# Patient Record
Sex: Male | Born: 1990 | Race: Black or African American | Hispanic: No | Marital: Single | Smoking: Current every day smoker
Health system: Southern US, Community
[De-identification: ages and names within clinical notes are randomized; demographics above are authoritative.]

## PROBLEM LIST (undated history)

## (undated) DIAGNOSIS — I10 Essential (primary) hypertension: Secondary | ICD-10-CM

## (undated) DIAGNOSIS — R683 Clubbing of fingers: Secondary | ICD-10-CM

## (undated) DIAGNOSIS — L309 Dermatitis, unspecified: Secondary | ICD-10-CM

## (undated) DIAGNOSIS — T7840XA Allergy, unspecified, initial encounter: Secondary | ICD-10-CM

## (undated) DIAGNOSIS — H539 Unspecified visual disturbance: Secondary | ICD-10-CM

## (undated) HISTORY — DX: Allergy, unspecified, initial encounter: T78.40XA

## (undated) HISTORY — DX: Unspecified visual disturbance: H53.9

## (undated) HISTORY — DX: Clubbing of fingers: R68.3

## (undated) HISTORY — DX: Essential (primary) hypertension: I10

## (undated) HISTORY — DX: Dermatitis, unspecified: L30.9

---
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
## 2008-07-16 ENCOUNTER — Ambulatory Visit: Payer: Self-pay | Admitting: Family Medicine

## 2008-10-07 ENCOUNTER — Emergency Department (HOSPITAL_COMMUNITY): Admission: EM | Admit: 2008-10-07 | Discharge: 2008-10-07 | Payer: Self-pay | Admitting: Family Medicine

## 2009-02-03 ENCOUNTER — Ambulatory Visit: Payer: Self-pay | Admitting: Family Medicine

## 2009-06-30 ENCOUNTER — Encounter: Admission: RE | Admit: 2009-06-30 | Discharge: 2009-06-30 | Payer: Self-pay | Admitting: Family Medicine

## 2009-06-30 ENCOUNTER — Ambulatory Visit: Payer: Self-pay | Admitting: Family Medicine

## 2009-07-08 ENCOUNTER — Ambulatory Visit: Payer: Self-pay | Admitting: Family Medicine

## 2009-08-03 ENCOUNTER — Ambulatory Visit: Payer: Self-pay | Admitting: Family Medicine

## 2009-10-08 ENCOUNTER — Ambulatory Visit: Payer: Self-pay | Admitting: Family Medicine
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
## 2013-09-19 ENCOUNTER — Ambulatory Visit (INDEPENDENT_AMBULATORY_CARE_PROVIDER_SITE_OTHER): Payer: BC Managed Care – PPO | Admitting: Medical

## 2013-09-19 ENCOUNTER — Encounter: Payer: Self-pay | Admitting: Medical

## 2013-09-19 VITALS — BP 120/68 | HR 60 | Temp 97.7°F | Resp 16 | Ht 69.0 in | Wt 134.0 lb

## 2013-09-19 DIAGNOSIS — R683 Clubbing of fingers: Secondary | ICD-10-CM

## 2013-09-19 DIAGNOSIS — L74519 Primary focal hyperhidrosis, unspecified: Secondary | ICD-10-CM

## 2013-09-19 DIAGNOSIS — T161XXA Foreign body in right ear, initial encounter: Secondary | ICD-10-CM

## 2013-09-19 DIAGNOSIS — IMO0002 Reserved for concepts with insufficient information to code with codable children: Secondary | ICD-10-CM

## 2013-09-19 DIAGNOSIS — L74513 Primary focal hyperhidrosis, soles: Secondary | ICD-10-CM

## 2013-09-19 DIAGNOSIS — L608 Other nail disorders: Secondary | ICD-10-CM

## 2013-09-19 DIAGNOSIS — T169XXA Foreign body in ear, unspecified ear, initial encounter: Secondary | ICD-10-CM

## 2013-09-19 NOTE — Progress Notes (Signed)
Subjective: Here as a new (old returning) patient today.  Accompanied by his mother.  Patient is presenting for 1 week of right-sided ear pain. He first noticed the pain when he was cleaning his ear over the past weekend. The pain has only bothered him when he touches his ear. Denies ear fullness, decreased hearing, drainage, bleeding, swelling of his ear, fevers, headaches. He also has not had to take any medication for the pain.  Patient does use Q-tips to clean his ears. Of note, about a year ago, he reports that he was using a Q-tip and actually broke the end and got it stuck in his right ear. He had his uncle remove it and thought he got most of it but never went to have it evaluated. Thereafter he had no residual symptoms.  No other concerns or complaints.  He reports sweaty feet, some dry and scaly skin of soles of feet.   Is somewhat sweaty person in general, but without other c/o.  Has hx/o clubbing, long term for years, ruled out prior for cardiopulmonary disease, prior extensive workup per mother.   ROS as in subjective.  Objective:  BP 120/68  Pulse 60  Temp(Src) 97.7 F (36.5 C) (Oral)  Resp 16  Ht 5\' 9"  (1.753 m)  Wt 134 lb (60.782 kg)  BMI 19.78 kg/m2  Gen: wd, wn,nad Skin: no abnormality at the right ear, mild scaling of right volar foot Ears: Right ear is non-erythematous, no edema, drainage or bleeding, TM is partially visualized but obstructed by a clumpy yellow collection of what appears to be cerumen vs old cotton swab.  Left ear is non-erythematous, not draining, TM round, non-perforated and intact.  Heart: RRR, normal S1, S2, no murmurs Lungs: clear Ext: no edema Pulses normal, normal cap refill, quite remarkable clubbing of nails throughout feet and toes   Assessment:  Encounter Diagnoses  Name Primary?  . Foreign body in ear, right, initial encounter Yes  . Clubbing of nails   . Hyperhidrosis of feet     Plan: With his consent use warm water lavage to  flush out the cotton ball of his right ear canal with success.  We were unable to grasp the cotton ball with alligator forceps.  Hyperhidrosis-advise daily good hygiene, can use drying powders for the feet, but wear sandals during the summer and to let the feet air out  Clubbing of nails - reviewed chart records all alluding to prior eval and no specific cause found.  Mom also notes prior cardiopulmonary eval with no cause found.  In general denies SOB, wheezing, dyspnea, chest pain, fatigue.

## 2014-07-22 ENCOUNTER — Encounter: Payer: Self-pay | Admitting: Medical

## 2014-07-22 ENCOUNTER — Ambulatory Visit (INDEPENDENT_AMBULATORY_CARE_PROVIDER_SITE_OTHER): Payer: 59 | Admitting: Medical

## 2014-07-22 VITALS — BP 130/84 | HR 86 | Temp 97.9°F | Wt 133.4 lb

## 2014-07-22 DIAGNOSIS — N452 Orchitis: Secondary | ICD-10-CM

## 2014-07-22 DIAGNOSIS — R1032 Left lower quadrant pain: Secondary | ICD-10-CM

## 2014-07-22 DIAGNOSIS — R1030 Lower abdominal pain, unspecified: Secondary | ICD-10-CM

## 2014-07-22 LAB — POCT WET PREP (WET MOUNT)
Bacteria Wet Prep HPF POC: NEGATIVE
Clue Cells Wet Prep Whiff POC: NEGATIVE

## 2014-07-22 LAB — POCT URINALYSIS DIPSTICK
BILIRUBIN UA: NEGATIVE
GLUCOSE UA: NEGATIVE
KETONES UA: NEGATIVE
LEUKOCYTES UA: NEGATIVE
NITRITE UA: NEGATIVE
PH UA: 6
Protein, UA: NEGATIVE
RBC UA: NEGATIVE
Urobilinogen, UA: NEGATIVE

## 2014-07-22 NOTE — Addendum Note (Signed)
Addended by: Herminio CommonsJOHNSON, SABRINA A on: 07/22/2014 01:54 PM   Modules accepted: Orders
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
## 2014-07-23 ENCOUNTER — Other Ambulatory Visit: Payer: Self-pay | Admitting: Medical

## 2014-07-23 LAB — HIV ANTIBODY (ROUTINE TESTING W REFLEX): HIV: NONREACTIVE

## 2014-07-23 LAB — RPR

## 2014-07-23 LAB — GC/CHLAMYDIA PROBE AMP
CT Probe RNA: NEGATIVE
GC Probe RNA: NEGATIVE

## 2014-07-23 MED ORDER — FLUCONAZOLE 150 MG PO TABS: ORAL_TABLET | ORAL | Status: DC

## 2014-07-23 MED ORDER — CIPROFLOXACIN HCL 500 MG PO TABS: 500.0000 mg | ORAL_TABLET | Freq: Two times a day (BID) | ORAL | Status: DC

## 2014-07-23 NOTE — Progress Notes (Signed)
Voice mail not set up [facility]

## 2014-08-10 ENCOUNTER — Encounter: Payer: Self-pay | Admitting: Medical

## 2014-08-10 ENCOUNTER — Ambulatory Visit (INDEPENDENT_AMBULATORY_CARE_PROVIDER_SITE_OTHER): Payer: 59 | Admitting: Medical

## 2014-08-10 VITALS — BP 110/60 | HR 66 | Temp 98.1°F | Resp 16 | Ht 69.2 in | Wt 132.0 lb

## 2014-08-10 DIAGNOSIS — L609 Nail disorder, unspecified: Secondary | ICD-10-CM | POA: Diagnosis not present

## 2014-08-10 DIAGNOSIS — F17200 Nicotine dependence, unspecified, uncomplicated: Secondary | ICD-10-CM

## 2014-08-10 DIAGNOSIS — Z0000 Encounter for general adult medical examination without abnormal findings: Secondary | ICD-10-CM

## 2014-08-10 DIAGNOSIS — R683 Clubbing of fingers: Secondary | ICD-10-CM

## 2014-08-10 DIAGNOSIS — Z720 Tobacco use: Secondary | ICD-10-CM | POA: Diagnosis not present

## 2014-08-10 LAB — POCT URINALYSIS DIPSTICK
BILIRUBIN UA: NEGATIVE
Blood, UA: NEGATIVE
Glucose, UA: NEGATIVE
KETONES UA: NEGATIVE
Leukocytes, UA: NEGATIVE
Nitrite, UA: NEGATIVE
Urobilinogen, UA: NEGATIVE
pH, UA: 6

## 2014-08-10 LAB — CBC WITH DIFFERENTIAL/PLATELET
BASOS ABS: 0.1 10*3/uL (ref 0.0–0.1)
Basophils Relative: 1 % (ref 0–1)
EOS PCT: 4 % (ref 0–5)
Eosinophils Absolute: 0.2 10*3/uL (ref 0.0–0.7)
HCT: 45.2 % (ref 39.0–52.0)
Hemoglobin: 15.3 g/dL (ref 13.0–17.0)
LYMPHS ABS: 2.3 10*3/uL (ref 0.7–4.0)
LYMPHS PCT: 42 % (ref 12–46)
MCH: 29.3 pg (ref 26.0–34.0)
MCHC: 33.8 g/dL (ref 30.0–36.0)
MCV: 86.6 fL (ref 78.0–100.0)
MONO ABS: 0.7 10*3/uL (ref 0.1–1.0)
MONOS PCT: 12 % (ref 3–12)
MPV: 10 fL (ref 8.6–12.4)
Neutro Abs: 2.3 10*3/uL (ref 1.7–7.7)
Neutrophils Relative %: 41 % — ABNORMAL LOW (ref 43–77)
PLATELETS: 211 10*3/uL (ref 150–400)
RBC: 5.22 MIL/uL (ref 4.22–5.81)
RDW: 14.4 % (ref 11.5–15.5)
WBC: 5.5 10*3/uL (ref 4.0–10.5)

## 2014-08-10 LAB — LIPID PANEL
CHOL/HDL RATIO: 2.8 ratio
Cholesterol: 162 mg/dL (ref 0–200)
HDL: 57 mg/dL (ref >=40)
LDL Cholesterol: 89 mg/dL (ref 0–99)
TRIGLYCERIDES: 80 mg/dL (ref <150)
VLDL: 16 mg/dL (ref 0–40)

## 2014-08-10 LAB — COMPREHENSIVE METABOLIC PANEL
ALK PHOS: 57 U/L (ref 39–117)
ALT: 8 U/L (ref 0–53)
AST: 14 U/L (ref 0–37)
Albumin: 4.4 g/dL (ref 3.5–5.2)
BILIRUBIN TOTAL: 0.7 mg/dL (ref 0.2–1.2)
BUN: 23 mg/dL (ref 6–23)
CO2: 30 meq/L (ref 19–32)
CREATININE: 0.97 mg/dL (ref 0.50–1.35)
Calcium: 9.8 mg/dL (ref 8.4–10.5)
Chloride: 104 mEq/L (ref 96–112)
GLUCOSE: 88 mg/dL (ref 70–99)
Potassium: 4.2 mEq/L (ref 3.5–5.3)
SODIUM: 141 meq/L (ref 135–145)
TOTAL PROTEIN: 7.3 g/dL (ref 6.0–8.3)

## 2014-08-10 LAB — TSH: TSH: 1.374 u[IU]/mL (ref 0.350–4.500)
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
## 2014-08-20 ENCOUNTER — Telehealth: Payer: Self-pay | Admitting: Internal Medicine

## 2014-08-20 NOTE — Telephone Encounter (Signed)
Pt mom would like to talk to you in regards to pt clubbing on his hands and feet. She would just like to talk to you about the condition and when this all started. Pt mom is on [employer]. Call erica @ [phone]

## 2014-09-02 NOTE — Telephone Encounter (Signed)
I spoke to mother.  She is going to get records.  Wants to hold off on any other testing until we review prior records including heart and genetic testing from 2008

## 2014-09-09 ENCOUNTER — Telehealth: Payer: Self-pay | Admitting: Medical

## 2014-09-09 NOTE — Telephone Encounter (Signed)
Please call mom and let her know that I reviewed the documents she dropped off.   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  He reportedly had echocardiogram of heart back in 2008 as well.   This was good records to have on hand, but it would still be helpful to get the prior cardiac record.   Please see if she or us can track down the 2008 EKG, and cardiac eval notes from Dr. Rosiland OzScott Buck.  I would like to compare the old EKG with the new EKG we did recently.

## 2014-09-09 NOTE — Telephone Encounter (Signed)
NO VOICEMAIL  

## 2014-09-10 NOTE — Telephone Encounter (Signed)
Made patient aware. He will get notes and get to you for review.

## 2015-03-12 ENCOUNTER — Encounter: Payer: Self-pay | Admitting: Medical

## 2015-03-12 ENCOUNTER — Ambulatory Visit (INDEPENDENT_AMBULATORY_CARE_PROVIDER_SITE_OTHER): Payer: 59 | Admitting: Medical

## 2015-03-12 ENCOUNTER — Telehealth: Payer: Self-pay

## 2015-03-12 VITALS — BP 122/72 | HR 79 | Wt 135.0 lb

## 2015-03-12 DIAGNOSIS — M545 Low back pain: Secondary | ICD-10-CM

## 2015-03-12 DIAGNOSIS — N452 Orchitis: Secondary | ICD-10-CM | POA: Diagnosis not present

## 2015-03-12 DIAGNOSIS — N50812 Left testicular pain: Secondary | ICD-10-CM | POA: Diagnosis not present

## 2015-03-12 DIAGNOSIS — Z202 Contact with and (suspected) exposure to infections with a predominantly sexual mode of transmission: Secondary | ICD-10-CM | POA: Diagnosis not present

## 2015-03-12 LAB — POCT URINALYSIS DIPSTICK
Bilirubin, UA: NEGATIVE
Blood, UA: NEGATIVE
Glucose, UA: NEGATIVE
KETONES UA: NEGATIVE
Leukocytes, UA: NEGATIVE
Nitrite, UA: NEGATIVE
Protein, UA: NEGATIVE
Urobilinogen, UA: 0.2
pH, UA: 6

## 2015-03-12 MED ORDER — CIPROFLOXACIN HCL 500 MG PO TABS: 500.0000 mg | ORAL_TABLET | Freq: Two times a day (BID) | ORAL | Status: DC

## 2015-03-12 NOTE — Telephone Encounter (Signed)
No just the cipro.  He had yeast in the urine last visit.   Just take the cipro for now.

## 2015-03-12 NOTE — Telephone Encounter (Signed)
Pt informed

## 2015-03-12 NOTE — Telephone Encounter (Signed)
Pt was here today, mom called saying when they got to the pharmacy they only had the Cipro and not the Fluconazole. Were both of them supposed to be called in or just the one? I will call in the fluconazole if it is ok to do so.

## 2015-03-12 NOTE — Progress Notes (Signed)
Subjective: Chief Complaint  Patient presents with  . bladder infection    left testicle feels "weird" and back aches on left side. urine obtained. started a couple weeks ago   Here for left testicular discomfort.  Started few days ago with unusual feeling of left testicle.  Hasn't been swollen, but no abdominal discomfort. No rash, no right testicle pain or swelling, no penile pain.   Has some discomfort with urination, but no penile discharge.   No hematuria, no odor in urine.  No back pain.  No fever, no NVD.  Last STD testing was back in April when he first came in for similar.   He was negative for STD then and responded well to Cipro.   He does have new sexual partners with unprotected sex.   His girlfriend recently told him she was + for chlamydia.   No other aggravating or relieving factors. No other complaint.  Past Medical History  Diagnosis Date  . Eczema 1995    feet  . Vision abnormalities 2005    wears glasses  . Allergy 2008    Seasonal  . Clubbing of fingers    ROS as in subjective   Objective: BP 122/72 mmHg  Pulse 79  Wt 135 lb (61.236 kg)  General appearance: alert, no distress, WD/WN Abdomen: +bs, soft, non tender, non distended, no masses, no hepatomegaly, no splenomegaly GU: normal male genitalia, circumcised, no obvious tenderness, swelling, mass, no lymphadenopathy, no rash.  Normal exam DRE: deferred    Assessment: Encounter Diagnoses  Name Primary?  . Pain in left testicle Yes  . Left low back pain, with sciatica presence unspecified   . Venereal disease contact   . Orchitis, left     Plan: STD labs today.   discussed safe sex.  discussed his similar presentation back in April when he responded well to Cipro.  Begin Cipro, but f/u pending STD screen.

## 2015-03-13 LAB — GC/CHLAMYDIA PROBE AMP
CT Probe RNA: DETECTED — AB
GC Probe RNA: NOT DETECTED

## 2015-03-13 LAB — HIV ANTIBODY (ROUTINE TESTING W REFLEX): HIV 1&2 Ab, 4th Generation: NONREACTIVE

## 2015-03-13 LAB — RPR

## 2015-03-15 ENCOUNTER — Other Ambulatory Visit: Payer: Self-pay | Admitting: Medical

## 2015-03-15 MED ORDER — AZITHROMYCIN 1 G PO PACK: 1.0000 g | PACK | Freq: Once | ORAL | Status: DC

## 2015-04-08 ENCOUNTER — Ambulatory Visit: Payer: 59 | Admitting: Medical

## 2015-04-09 ENCOUNTER — Ambulatory Visit (INDEPENDENT_AMBULATORY_CARE_PROVIDER_SITE_OTHER): Payer: BLUE CROSS/BLUE SHIELD | Admitting: Medical

## 2015-04-09 ENCOUNTER — Encounter: Payer: Self-pay | Admitting: Medical

## 2015-04-09 VITALS — BP 118/82 | HR 81 | Wt 133.0 lb

## 2015-04-09 DIAGNOSIS — R9431 Abnormal electrocardiogram [ECG] [EKG]: Secondary | ICD-10-CM

## 2015-04-09 DIAGNOSIS — L609 Nail disorder, unspecified: Secondary | ICD-10-CM | POA: Diagnosis not present

## 2015-04-09 DIAGNOSIS — R079 Chest pain, unspecified: Secondary | ICD-10-CM

## 2015-04-09 DIAGNOSIS — K2970 Gastritis, unspecified, without bleeding: Secondary | ICD-10-CM

## 2015-04-09 DIAGNOSIS — Z8619 Personal history of other infectious and parasitic diseases: Secondary | ICD-10-CM | POA: Diagnosis not present

## 2015-04-09 DIAGNOSIS — R683 Clubbing of fingers: Secondary | ICD-10-CM

## 2015-04-09 NOTE — Progress Notes (Signed)
Subjective: Chief Complaint  Patient presents with  . Follow-up    on chlamydia. will give urine after drinks water.   Here for f/u. Last visit he was having left testicular discomfort, was + for chlamydia.  He ended up taking the Cipro and azithromycin.  symptoms resolved.    Here with mother to discuss chest pain.  Having some chest discomfort.   2 weeks ago had eggs, sausage and toast, and after a few minutes had some chest pains.   Was worried, laid down most of the day.  Went to work later in the day, by next morning still had chest pain but it was improved.   Still has some intermittent similar pains.  He also notes that he drinks juice all day including orange juice.   No other aggravating or relieving factors. No other complaint.  Past Medical History  Diagnosis Date  . Eczema 1995    feet  . Vision abnormalities 2005    wears glasses  . Allergy 2008    Seasonal  . Clubbing of fingers    ROS as in subjective   Objective: BP 118/82 mmHg  Pulse 81  Wt 133 lb (60.328 kg)  General appearance: alert, no distress, WD/WN Chest nontender, normal I:E Lungs clear Heart RRR, normal s1, s2, no murmurs Abdomen: +bs, soft, non tender, non distended, no masses, no hepatomegaly, no splenomegaly    Assessment: Encounter Diagnoses  Name Primary?  . Chest pain, unspecified chest pain type Yes  . Gastritis   . History of chlamydia   . Clubbing of nail   . Nonspecific abnormal electrocardiogram (ECG) (EKG)     Plan: Chest pain, gastritis - begin samples of Nexium, avoid GERD triggers, and recheck if not resolving . Back off on orange juice and acidic foods  Hx/o chlamydia - symptoms resolved, repeat testing today to ensure resolution  Clubbing of nails, abnormal EKG - discussed 08/2014 physical visit.   Consider echo.   He will check insurance and let me know.  Of note, reviewed records from 2008 and prior records suggesting congential clubbing and prior EKG and Holter eval by  cardiology, Dr. Ace GinsBuck.

## 2015-04-10 LAB — GC/CHLAMYDIA PROBE AMP
CT Probe RNA: NOT DETECTED
GC PROBE AMP APTIMA: NOT DETECTED

## 2015-04-23 ENCOUNTER — Telehealth: Payer: Self-pay

## 2015-04-23 NOTE — Telephone Encounter (Signed)
Refer to Dr. Donnie Aho

## 2015-04-23 NOTE — Telephone Encounter (Signed)
Pts mother called and stated that the Echo would be covered if it was filed under preventive. Otherwise it will not be covered. York Spaniel is it a better idea to send him to a doctor that does it in office for a cheaper rate. [facility] stated the minimum would be 1500 dollars. So she would like a cheaper route. Wants to know your thoughts.

## 2015-04-26 NOTE — Telephone Encounter (Signed)
pts mom is aware 

## 2015-04-26 NOTE — Telephone Encounter (Signed)
Pt has echo scheduled at dr Tawana Scale office on 05/10/15 at 8:45 am. Berkley Harvey number is [phone]

## 2015-04-29 ENCOUNTER — Telehealth: Payer: Self-pay | Admitting: Medical

## 2015-04-29 NOTE — Telephone Encounter (Signed)
[name] Camacho @ Dr Sabino Donovan office called request documentation of prior auth for pt's ech on 2/6 be faxed to her at their office @ [phone]. She need more than the auth#. The documentation from insurance should include dates service will be covered, etc all in writing

## 2015-04-30 NOTE — Telephone Encounter (Signed)
Spoke with [name] Camacho and informed her I didn't get dates of service since I had told them when the appt was they didn't give me dates of service she copied down the auth number again and stated she was putting it in her records.

## 2015-05-17 ENCOUNTER — Telehealth: Payer: Self-pay | Admitting: Medical

## 2015-05-17 NOTE — Telephone Encounter (Signed)
Please check scan pile.   Also, he can certainly call Dr.Tilley's office for results tool.   I think the echo was sent but check with Tammy in scan pile

## 2015-05-17 NOTE — Telephone Encounter (Signed)
tammy do you have this?

## 2015-05-17 NOTE — Telephone Encounter (Signed)
Please call re tests done at Dr. York Spaniel, they have not received any results

## 2015-05-18 NOTE — Telephone Encounter (Signed)
The ultrasound/echocardiogram is overall fine.  There are minor findings that we can discuss on his next physical but nothing to be concerned about.  I'm glad we could be reassuring.

## 2015-05-18 NOTE — Telephone Encounter (Signed)
Pt is aware.  

## 2015-05-18 NOTE — Telephone Encounter (Signed)
Placed report on your desk

## 2015-05-18 NOTE — Telephone Encounter (Signed)
Will you review the report and let me know what to tell him? It is on your desk

## 2015-05-19 ENCOUNTER — Telehealth: Payer: Self-pay

## 2015-05-19 NOTE — Telephone Encounter (Signed)
Pt's mom called to go over echo results. Told her what I told her son yesterday and said she can get a more in depth answer from Dr Tilley's York Spaniele. Told her that Vincenza Hews stated it was overall fine and the minor finding are nothing to worry about and they can discuss further in may

## 2015-06-17 ENCOUNTER — Encounter: Payer: Self-pay | Admitting: Internal Medicine

## 2015-12-30 ENCOUNTER — Encounter: Payer: Self-pay | Admitting: Medical

## 2015-12-30 ENCOUNTER — Ambulatory Visit
Admission: RE | Admit: 2015-12-30 | Discharge: 2015-12-30 | Disposition: A | Discharge status: Home / Self Care | Payer: BLUE CROSS/BLUE SHIELD | Source: Ambulatory Visit | Attending: Medical | Admitting: Medical

## 2015-12-30 ENCOUNTER — Telehealth: Payer: Self-pay

## 2015-12-30 ENCOUNTER — Other Ambulatory Visit: Payer: Self-pay | Admitting: Medical

## 2015-12-30 ENCOUNTER — Ambulatory Visit (INDEPENDENT_AMBULATORY_CARE_PROVIDER_SITE_OTHER): Payer: BLUE CROSS/BLUE SHIELD | Admitting: Medical

## 2015-12-30 VITALS — BP 132/80 | HR 68 | Temp 98.1°F | Ht 69.0 in | Wt 144.8 lb

## 2015-12-30 DIAGNOSIS — R042 Hemoptysis: Secondary | ICD-10-CM

## 2015-12-30 DIAGNOSIS — R05 Cough: Secondary | ICD-10-CM

## 2015-12-30 DIAGNOSIS — R059 Cough, unspecified: Secondary | ICD-10-CM

## 2015-12-30 DIAGNOSIS — F17200 Nicotine dependence, unspecified, uncomplicated: Secondary | ICD-10-CM

## 2015-12-30 DIAGNOSIS — J3489 Other specified disorders of nose and nasal sinuses: Secondary | ICD-10-CM

## 2015-12-30 DIAGNOSIS — Z720 Tobacco use: Secondary | ICD-10-CM | POA: Diagnosis not present

## 2015-12-30 MED ORDER — AMOXICILLIN-POT CLAVULANATE 875-125 MG PO TABS: 1.0000 | ORAL_TABLET | Freq: Two times a day (BID) | ORAL | 0 refills | Status: DC

## 2015-12-30 MED ORDER — PROMETHAZINE-DM 6.25-15 MG/5ML PO SYRP: 5.0000 mL | ORAL_SOLUTION | Freq: Four times a day (QID) | ORAL | 0 refills | Status: DC | PRN

## 2015-12-30 NOTE — Progress Notes (Signed)
Subjective:  [name] Camacho is a 25 y.o. male who presents for cough, bloody sputum.   Has had 1 week hx/o cough, sinus and chest congestion, headache, ear pressure, lots of cough, some wheezy sounds, but no SOB, no chest pain, no NVD, no fever, no sweats.  No sick exposures, no recent exposures to persons worrisome for TB.    He notes productive sputum and nasal discharge, but morning when coughing up sputum, got 1/2 shot glass full of bloody mucous.  Patient is a smoker.  No other aggravating or relieving factors.  No other c/o.  The following portions of the patient's history were reviewed and updated as appropriate: allergies, current medications, past family history, past medical history, past social history, past surgical history and problem list.  ROS as in subjective  Past Medical History:  Diagnosis Date  . Allergy 2008   Seasonal  . Clubbing of fingers   . Eczema 1995   feet  . Vision abnormalities 2005   wears glasses     Objective: BP 132/80 (BP Location: Right Arm, Patient Position: Sitting, Cuff Size: Normal)   Pulse 68   Temp 98.1 F (36.7 C) (Oral)   Ht 5\' 9"  (1.753 m)   Wt 144 lb 12.8 oz (65.7 kg)   SpO2 97%   BMI 21.38 kg/m   General appearance: Alert, WD/WN, no distress                             Skin: warm, no rash, no diaphoresis                           Head: no sinus tenderness                            Eyes: conjunctiva normal, corneas clear, PERRLA                            Ears: pearly TMs, external ear canals normal                          Nose: septum midline, turbinates swollen, with erythema and clear discharge             Mouth/throat: MMM, tongue normal, mild pharyngeal erythema                           Neck: supple, no adenopathy, no thyromegaly, nontender                          Heart: RRR, normal S1, S2, no murmurs                         Lungs: +bronchial breath sounds, +scattered rhonchi, no wheezes, no rales  Extremities: no edema, nontender     Assessment: Encounter Diagnoses  Name Primary?  . Sinus pressure Yes  . Smoker   . Hemoptysis   . Cough      Plan:  Discussed symptoms.  Will send for CXR.   Advised smoking cessation.   Hemoptysis likely from bloody mucous in the sinuses draining.  So we will likely treat for sinobronchitis, but send for xray to rule out other cause of hemoptysis.  Rest, hydrate well.    [name] Camacho was seen today for allergic reaction.  Diagnoses and all orders for this visit:  Sinus pressure -     DG Chest 2 View; Future -     Pulse oximetry (single); Future  Smoker -     DG Chest 2 View; Future -     Pulse oximetry (single); Future  Hemoptysis -     DG Chest 2 View; Future -     Pulse oximetry (single); Future  Cough -     DG Chest 2 View; Future -     Pulse oximetry (single); Future

## 2015-12-30 NOTE — Telephone Encounter (Signed)
Spoke with pt and mother- answered all questions in regard to x-ray results. Trixie Rude/RLB

## 2015-12-30 NOTE — Telephone Encounter (Signed)
-----   Message from Jac Canavanavid S Tysinger, PA-C sent at 12/30/2015  3:09 PM EDT ----- Herby AbrahamXray suggests bronchitis.  No obvious pneumonia.   Begin Augmentin antibiotic BID, cough medication sent, rest, QUIT SMOKING!  Lets f/u in 10-14 days, but sooner if not improving or if continued or worsening bloody sputum.

## 2016-02-23 ENCOUNTER — Ambulatory Visit (INDEPENDENT_AMBULATORY_CARE_PROVIDER_SITE_OTHER): Payer: BLUE CROSS/BLUE SHIELD | Admitting: Medical

## 2016-02-23 ENCOUNTER — Encounter: Payer: Self-pay | Admitting: Medical

## 2016-02-23 VITALS — BP 128/90 | HR 88 | Wt 148.8 lb

## 2016-02-23 DIAGNOSIS — N5089 Other specified disorders of the male genital organs: Secondary | ICD-10-CM | POA: Insufficient documentation

## 2016-02-23 DIAGNOSIS — Z202 Contact with and (suspected) exposure to infections with a predominantly sexual mode of transmission: Secondary | ICD-10-CM | POA: Diagnosis not present

## 2016-02-23 DIAGNOSIS — N50819 Testicular pain, unspecified: Secondary | ICD-10-CM | POA: Insufficient documentation

## 2016-02-23 DIAGNOSIS — N50812 Left testicular pain: Secondary | ICD-10-CM

## 2016-02-23 LAB — POCT URINALYSIS DIPSTICK
Bilirubin, UA: NEGATIVE
Blood, UA: NEGATIVE
Glucose, UA: NEGATIVE
Ketones, UA: NEGATIVE
Nitrite, UA: NEGATIVE
Protein, UA: NEGATIVE
Spec Grav, UA: 1.025
Urobilinogen, UA: NEGATIVE
pH, UA: 6

## 2016-02-23 LAB — CBC
HCT: 42.6 % (ref 38.5–50.0)
Hemoglobin: 13.9 g/dL (ref 13.2–17.1)
MCH: 29.1 pg (ref 27.0–33.0)
MCHC: 32.6 g/dL (ref 32.0–36.0)
MCV: 89.3 fL (ref 80.0–100.0)
MPV: 10.4 fL (ref 7.5–12.5)
PLATELETS: 210 10*3/uL (ref 140–400)
RBC: 4.77 MIL/uL (ref 4.20–5.80)
RDW: 14.5 % (ref 11.0–15.0)
WBC: 9.5 10*3/uL (ref 4.0–10.5)

## 2016-02-23 MED ORDER — CIPROFLOXACIN HCL 500 MG PO TABS: 500.0000 mg | ORAL_TABLET | Freq: Two times a day (BID) | ORAL | 0 refills | Status: DC

## 2016-02-23 MED ORDER — TRAMADOL HCL 50 MG PO TABS: 50.0000 mg | ORAL_TABLET | Freq: Three times a day (TID) | ORAL | 0 refills | Status: DC | PRN

## 2016-02-23 NOTE — Progress Notes (Signed)
Subjective: Chief Complaint  Patient presents with  . pain left testicle    pain ,swelling on left side    Here for left testicular discomfort.  I saw him prior for similar 03/2015.  He notes few day hx/o left testcular swelling and pain, hurts in stomach and hurts to walk.   No injury or trauma.  No recent wrestling. Using condoms.   Has multiple partners, just 2 since 03/2015 last labs.   Girlfriend is pregnant currently.  No rash, no burning with urination.   No odor in urine.   No fever.   No rectal pain, no problems with bowels.  He ended up using cipro last year for similar testicular pain.  No other aggravating or relieving factors. No other complaint.  Past Medical History:  Diagnosis Date  . Allergy 2008   Seasonal  . Clubbing of fingers   . Eczema 1995   feet  . Vision abnormalities 2005   wears glasses   ROS as in subjective   Objective: BP 128/90   Pulse 88   Wt 148 lb 12.8 oz (67.5 kg)   SpO2 98%   BMI 21.97 kg/m   General appearance: alert, no distress, WD/WN Abdomen: +bs, soft, mild left inguinal tendenrss, othewise non tender, non distended, no masses, no hepatomegaly, no splenomegaly GU: normal male genitalia, circumcised, left testiclue quite tender, swollen mildly but no other obvious tenderness, swelling, mass, no lymphadenopathy, no rash.  No hernia.   DRE: deferred    Assessment: Encounter Diagnoses  Name Primary?  . Testicular pain, left Yes  . Testicular swelling   . Venereal disease contact     Plan: STD labs today.   discussed safe sex.   Begin Cipro, Ultram for pain.  F/u pending STD screen.   [name] Camacho was seen today for pain left testicle.  Diagnoses and all orders for this visit:  Testicular pain, left -     HIV antibody -     RPR -     GC/Chlamydia Probe Amp -     CBC -     POCT urinalysis dipstick -     Urinalysis Dipstick -     Mycoplasma / Ureaplasma Culture  Testicular swelling -     HIV antibody -     RPR -      GC/Chlamydia Probe Amp -     CBC -     POCT urinalysis dipstick -     Mycoplasma / Ureaplasma Culture  Venereal disease contact -     HIV antibody -     RPR -     GC/Chlamydia Probe Amp -     CBC -     POCT urinalysis dipstick -     Mycoplasma / Ureaplasma Culture  Other orders -     ciprofloxacin (CIPRO) 500 MG tablet; Take 1 tablet (500 mg total) by mouth 2 (two) times daily. -     traMADol (ULTRAM) 50 MG tablet; Take 1 tablet (50 mg total) by mouth every 8 (eight) hours as needed.

## 2016-02-24 LAB — GC/CHLAMYDIA PROBE AMP
CT Probe RNA: NOT DETECTED
GC PROBE AMP APTIMA: NOT DETECTED

## 2016-02-24 LAB — HIV ANTIBODY (ROUTINE TESTING W REFLEX): HIV: NONREACTIVE

## 2016-02-24 LAB — RPR

## 2016-02-29 LAB — MYCOPLASMA / UREAPLASMA CULTURE

## 2016-03-14 ENCOUNTER — Ambulatory Visit: Payer: BLUE CROSS/BLUE SHIELD | Admitting: Medical

## 2016-03-15 ENCOUNTER — Ambulatory Visit (INDEPENDENT_AMBULATORY_CARE_PROVIDER_SITE_OTHER): Payer: BLUE CROSS/BLUE SHIELD | Admitting: Medical

## 2016-03-15 ENCOUNTER — Encounter: Payer: Self-pay | Admitting: Medical

## 2016-03-15 VITALS — BP 132/70 | HR 78

## 2016-03-15 DIAGNOSIS — N452 Orchitis: Secondary | ICD-10-CM

## 2016-03-15 DIAGNOSIS — N50819 Testicular pain, unspecified: Secondary | ICD-10-CM | POA: Diagnosis not present

## 2016-03-15 LAB — POCT URINALYSIS DIPSTICK
BILIRUBIN UA: NEGATIVE
Glucose, UA: NEGATIVE
Ketones, UA: NEGATIVE
Leukocytes, UA: NEGATIVE
NITRITE UA: NEGATIVE
PH UA: 6
Protein, UA: NEGATIVE
RBC UA: NEGATIVE
UROBILINOGEN UA: NEGATIVE

## 2016-03-15 MED ORDER — CIPROFLOXACIN HCL 500 MG PO TABS: 500.0000 mg | ORAL_TABLET | Freq: Two times a day (BID) | ORAL | 0 refills | Status: DC

## 2016-03-15 MED ORDER — TRAMADOL HCL 50 MG PO TABS: 50.0000 mg | ORAL_TABLET | Freq: Three times a day (TID) | ORAL | 0 refills | Status: DC | PRN

## 2016-03-15 MED ORDER — DOXYCYCLINE HYCLATE 100 MG PO TABS: 100.0000 mg | ORAL_TABLET | Freq: Two times a day (BID) | ORAL | 0 refills | Status: DC

## 2016-03-15 NOTE — Patient Instructions (Signed)
Orchitis Introduction Orchitis is swelling (inflammation) of a testicle caused by infection. Testicles are the male organs that produce sperm. The testicles are held in a fleshy sac (scrotum) located behind the penis. Orchitis usually affects only one testicle, but it can occur in both. The condition can develop suddenly. Orchitis can be caused by many different kinds of bacteria and viruses. What are the causes? Orchitis can be caused by either a bacterial or viral infection. Bacterial Infections  These often occur along with an infection of the coiled tube that collects sperm and sits on top of the testicle (epididymis).  In men who are not sexually active, bacterial orchitis usually starts as a urinary tract infection and spreads to the testicle.  In sexually active men, sexually transmitted infections are the most common cause of bacterial orchitis. These can include:  Gonorrhea.  Chlamydia. Viral Infections  Mumps is still the most common cause of viral orchitis, though mumps is now rare in many areas because of vaccination.  Other viruses that can cause orchitis include:  The chickenpox virus (varicella-zoster virus).  The virus that causes mononucleosis (Epstein-Barr virus). What increases the risk? Boys and men who have not been vaccinated against mumps are at risk for mumps orchitis. Risk factors for bacterial orchitis include:  Frequent urinary tract infections.  High-risk sexual behaviors.  Having a sexual partner with a sexually transmitted infection.  Having had urinary tract surgery.  Using a tube passed through the penis to drain urine (Foley catheter).  An enlarged prostate gland. What are the signs or symptoms? The most common symptoms of orchitis are swelling and pain in the scrotum. Other signs and symptoms may include:  Feeling generally sick (malaise).  Fever and chills.  Painful urination.  Painful ejaculation.  Blood or discharge from the  penis.  Nausea.  Headache.  Fatigue. How is this diagnosed? Your health care provider may suspect orchitis if you have a painful, swollen testicle along with other signs and symptoms of the condition. A physical exam will be done. Tests may also be done to help your health care provider make a diagnosis. These may include:  A blood test to check for signs of infection.  A urine test to check for a urinary tract infection.  Using a swab to collect a fluid sample from the tip of the penis to test for sexually transmitted infections.  Taking an image of the testicle using sound waves and a computer (testicular ultrasound). How is this treated? Treatment of orchitis depends on the cause. For orchitis caused by a bacterial infection, your health care provider will most likely prescribe antibiotic medicines. Bacterial infections usually clear up within a few days. Both viral infections and bacterial infections may be treated with:  Bed rest.  Anti-inflammatory medicines.  Pain medicines.  Elevating the scrotum and applying ice. Follow these instructions at home:  Rest as directed by your health care provider.  Take medicines only as directed by your health care provider.  If you were prescribed an antibiotic medicine, finish it all even if you start to feel better.  Elevate your scrotum and apply ice as directed:  Put ice in a plastic bag.  Place a small towel or pillow between your legs.  Rest your scrotum on the pillow or towel.  Place another towel between your skin and the plastic bag.  Leave the ice on for 20 minutes, 2-3 times a day. Contact a health care provider if:  You have a fever.  Pain  and swelling have not gotten better after 3 days. Get help right away if:  Your pain is getting worse.  The swelling in your testicle gets worse. This information is not intended to replace advice given to you by your health care provider. Make sure you discuss any  questions you have with your health care provider. Document Released: 03/17/2000 Document Revised: 08/26/2015 Document Reviewed: 08/07/2013  2017 [employer]

## 2016-03-15 NOTE — Progress Notes (Signed)
Subjective: Chief Complaint  Patient presents with  . follow up    still have the left side    Here for follow up on left testicular discomfort and swelling.  I spoke to his mother by phone/speaker phone at his request while he was here.  I saw him for this 02/23/16 and prior 03/2015.  Since last visit he completed all but 1 of his 10 day course of Cipro.  Feels 80% better, swelling resolved but still a lot of pain.   Hurts to move around at work, and does lots of moving and lifting at work.   He has 4 more ultram tablets left.  No injury or trauma.  No recent wrestling. Using condoms.   Has multiple partners, just 2 since 03/2015 last labs.   Girlfriend is pregnant currently.  No rash, no burning with urination.   No odor in urine.   No fever.   No rectal pain, no problems with bowels.  He ended up using cipro last year for similar testicular pain.  No other aggravating or relieving factors. No other complaint.  Past Medical History:  Diagnosis Date  . Allergy 2008   Seasonal  . Clubbing of fingers   . Eczema 1995   feet  . Vision abnormalities 2005   wears glasses   ROS as in subjective   Objective: BP 132/70   Pulse 78   SpO2 98%   General appearance: alert, no distress, WD/WN Abdomen: +bs, soft, mild left inguinal tenderness, otherwise non tender, non distended, no masses, no hepatomegaly, no splenomegaly GU: normal male genitalia, circumcised, left testicle moderately tender, swollen mildly but no other obvious tenderness, swelling, mass, no lymphadenopathy, no rash.  No hernia.   DRE: deferred    Assessment: Encounter Diagnoses  Name Primary?  . Testicle pain Yes  . Orchitis     Plan: Reviewed his recent labs, STD screen which was negative, UA normal today.    We will treat another round of Cipro, add doxycycline, ultram prn pain.  Gave note for work for today.   Will refer to urology since this is the 3rd time we have treated him for same.  I recommend ultrasound to  further evaluate.   [name] Camacho was seen today for follow up.  Diagnoses and all orders for this visit:  Testicle pain -     Urinalysis Dipstick -     Ambulatory referral to Urology  Orchitis -     Ambulatory referral to Urology  Other orders -     traMADol (ULTRAM) 50 MG tablet; Take 1 tablet (50 mg total) by mouth every 8 (eight) hours as needed. -     ciprofloxacin (CIPRO) 500 MG tablet; Take 1 tablet (500 mg total) by mouth 2 (two) times daily. -     doxycycline (VIBRA-TABS) 100 MG tablet; Take 1 tablet (100 mg total) by mouth 2 (two) times daily.

## 2018-05-27 ENCOUNTER — Ambulatory Visit (INDEPENDENT_AMBULATORY_CARE_PROVIDER_SITE_OTHER): Payer: Self-pay | Admitting: Medical

## 2018-05-27 VITALS — BP 120/76 | HR 80 | Temp 98.1°F | Resp 16 | Ht 69.0 in | Wt 147.8 lb

## 2018-05-27 DIAGNOSIS — N50819 Testicular pain, unspecified: Secondary | ICD-10-CM

## 2018-05-27 DIAGNOSIS — J301 Allergic rhinitis due to pollen: Secondary | ICD-10-CM

## 2018-05-27 LAB — POCT URINALYSIS DIP (PROADVANTAGE DEVICE)
Glucose, UA: NEGATIVE mg/dL
Leukocytes, UA: NEGATIVE
Nitrite, UA: NEGATIVE
RBC UA: NEGATIVE
SPECIFIC GRAVITY, URINE: 1.03
UUROB: NEGATIVE
pH, UA: 6 (ref 5.0–8.0)

## 2018-05-27 MED ORDER — DOXYCYCLINE HYCLATE 100 MG PO TABS: 100.0000 mg | ORAL_TABLET | Freq: Two times a day (BID) | ORAL | 0 refills | Status: DC

## 2018-05-27 NOTE — Patient Instructions (Signed)
Recommendations  Begin Doxycyline antibiotic for testicle pain/orchitis.  This is one of the medications we used in 2017 that cleared up your symptoms  Drink plenty of water daily  We will call with lab results  Consider going to health department in 3-4 weeks for full STD screening  Begin Zyrtec or Allegra allergy medication over the counter nightly at bedtime  Call or return if not improving      Orchitis  Orchitis is inflammation of a testicle. Testicles are the male organs that produce sperm. The testicles are held in a fleshy sac (scrotum) located behind the penis. Orchitis usually affects only one testicle, but it can affect both. Orchitis is caused by infection. Many kinds of bacteria and viruses can cause this infection. The condition can develop suddenly. What are the causes? This condition may be caused by:  Infection from viruses or bacteria.  Other organisms, such as fungi or parasites (rare). This is common in men who have a weak body defense system (immune system), such as men with HIV. Bacteria   Bacterial orchitis often occurs along with an infection of the tube that collects and stores sperm (epididymis).  In men who are not sexually active, this infection usually starts as a urinary tract infection and spreads to the testicle.  In sexually active men, sexually transmitted infections (STIs) are the most common cause of bacterial orchitis. These can include: ? Gonorrhea. ? Chlamydia. Viruses  Mumps is the most common cause of viral orchitis, though mumps is now rare in many areas because of vaccination.  Other viruses that can cause orchitis include: ? The chickenpox virus (varicella-zoster virus). ? The virus that causes mononucleosis (Epstein-Barr virus). What increases the risk? The following factors may make you more likely to develop this condition:  For viral orchitis: ? Not having been vaccinated against mumps.  For bacterial orchitis: ? Having  had frequent urinary tract infections. ? Engaging in high-risk sexual behaviors, such as having multiple sexual partners or having sex without using a condom. ? Having a sexual partner with an STI. ? Having had urinary tract surgery. ? Using a tube that is passed through the penis to drain urine (Foley catheter). ? Having an enlarged prostate gland. What are the signs or symptoms? The most common symptoms of orchitis are swelling and pain in the scrotum. Other signs and symptoms may include:  Feeling generally sick (malaise).  Fever and chills.  Painful urination.  Painful ejaculation.  Headache.  Fatigue.  Nausea.  Blood or discharge from the penis.  Swollen lymph nodes in the groin area (inguinal nodes). How is this diagnosed? This condition may be diagnosed based on:  Your symptoms. Your health care provider may suspect orchitis if you have a painful, swollen testicle along with other signs and symptoms of the condition.  A physical exam. You may also have other tests, including:  A blood test to check for signs of infection.  A urine test to check for a urinary tract infection or STI.  Using a swab to collect a fluid sample from the tip of the penis to test for STIs.  Taking an image of the testicle using sound waves and a computer (testicular ultrasound). How is this treated? Treatment for this condition depends on the cause.  For bacterial orchitis, your health care provider may prescribe antibiotic medicines. Bacterial infections usually clear up within a few days. For both viral infections and bacterial infections, you may be treated with:  Rest.  Anti-inflammatory medicines.  Pain medicines.  Raising (elevating) the scrotum with a towel or pillow underneath and applying ice. Follow these instructions at home:  Rest as directed by your health care provider.  Take over-the-counter and prescription medicines only as told by your health care  provider.  If you were prescribed an antibiotic medicine, take it as told by your health care provider. Do not stop taking the antibiotic even if you start to feel better.  Do not have sex until your health care provider says it is okay to do so.  Elevate your scrotum and apply ice as directed: ? Put ice in a plastic bag. ? Place a small towel or pillow between your legs. ? Rest your scrotum on the pillow or towel. ? Place another towel between your skin and the plastic bag. ? Leave the ice on for 20 minutes, 2-3 times a day.  Keep all follow-up visits as told by your health care provider. This is important. Contact a health care provider if:  You have a fever.  Pain and swelling have not gotten better after 3 days. Get help right away if:  Your pain is getting worse.  The swelling in your testicle gets worse. Summary  Orchitis is inflammation of a testicle. It is caused by an infection from bacteria or a virus.  The most common symptoms of orchitis are swelling and pain in the scrotum.  Treatment for this condition depends on the cause. It may include medicines to fight the infection, reduce inflammation, and relieve the pain.  Follow your health care provider's instructions about resting, icing, not having sex, and taking medicines. This information is not intended to replace advice given to you by your health care provider. Make sure you discuss any questions you have with your health care provider. Document Released: 03/17/2000 Document Revised: 04/06/2017 Document Reviewed: 04/06/2017 Elsevier Interactive Patient Education  2019 [employer].

## 2018-05-27 NOTE — Progress Notes (Signed)
Subjective: Chief Complaint  Patient presents with  . left testicle pain    left testicle pain X 2 weeks   allergies    Here for 2 week hx/o testicle pain in left testicle.     Had sex with male partner few weeks ago before the symptoms, condom broke, and since then having some discomfort.   No penile discharge, no burning with urination other then the day after condom broke.   Denies swelling in testicles.  No fever, no abdominal or back pain, no blood in urine, no bowel changes.  He has hx/o left testicle pain 3 years, ultimately I referred him to urology after symptom persisted.   He finally improved then on Cipro and Doxycyline for epididymis at that time.   Saw Dr. Hadley Pen at [facility].  Having some allergies with runny nose, congestion.  No cough, no sore throat, no ear pain, no NVD, no body aches or chills  No other aggravating or relieving factors. No other complaint.  ROS as in subjective   Objective: BP 120/76   Pulse 80   Temp 98.1 F (36.7 C) (Oral)   Resp 16   Ht 5\' 9"  (1.753 m)   Wt 147 lb 12.8 oz (67 kg)   SpO2 95%   BMI 21.83 kg/m   General appearance: alert, no distress, WD/WN,  HEENT: normocephalic, sclerae anicteric, TMs pearly, nares patent, no discharge or erythema, pharynx normal Oral cavity: MMM, no lesions Neck: supple, no lymphadenopathy, no thyromegaly, no masses Lungs: CTA bilaterally, no wheezes, rhonchi, or rales Abdomen: +bs, soft, non tender, non distended, no masses, no hepatomegaly, no splenomegaly Pulses: 2+ symmetric, upper and lower extremities, normal cap refill GU: normal male, circumcised, no mass, no lymphadenopathy but mild tenderness of left testicle without swelling or induration of skin  Assessment: Encounter Diagnoses  Name Primary?  . Testicle pain Yes  . Allergic rhinitis due to pollen, unspecified seasonality      Plan: Discussed symptoms, concerns, recommendations below  Recommendations  Begin  Doxycyline antibiotic for testicle pain/orchitis.  This is one of the medications we used in 2017 that cleared up your symptoms  Drink plenty of water daily  We will call with lab results  Consider going to health department in 3-4 weeks for full STD screening (since you have no insurance, this may save you considerable money)  Begin Zyrtec or Allegra allergy medication over the counter nightly at bedtime  Call or return if not improving   [name] Camacho was seen today for left testicle pain.  Diagnoses and all orders for this visit:  Testicle pain -     GC/Chlamydia Probe Amp -     POCT Urinalysis DIP (Proadvantage Device)  Allergic rhinitis due to pollen, unspecified seasonality  Other orders -     doxycycline (VIBRA-TABS) 100 MG tablet; Take 1 tablet (100 mg total) by mouth 2 (two) times daily.

## 2018-05-29 LAB — GC/CHLAMYDIA PROBE AMP
CHLAMYDIA, DNA PROBE: NEGATIVE
Neisseria gonorrhoeae by PCR: NEGATIVE

## 2018-06-08 ENCOUNTER — Emergency Department (HOSPITAL_COMMUNITY)
Admission: EM | Admit: 2018-06-08 | Discharge: 2018-06-09 | Disposition: A | Discharge status: Home / Self Care | Payer: PRIVATE HEALTH INSURANCE | Attending: Emergency Medicine | Admitting: Emergency Medicine

## 2018-06-08 ENCOUNTER — Encounter (HOSPITAL_COMMUNITY): Payer: Self-pay | Admitting: Emergency Medicine

## 2018-06-08 ENCOUNTER — Other Ambulatory Visit: Payer: Self-pay

## 2018-06-08 DIAGNOSIS — Y939 Activity, unspecified: Secondary | ICD-10-CM | POA: Insufficient documentation

## 2018-06-08 DIAGNOSIS — Z5321 Procedure and treatment not carried out due to patient leaving prior to being seen by health care provider: Secondary | ICD-10-CM | POA: Insufficient documentation

## 2018-06-08 DIAGNOSIS — Y998 Other external cause status: Secondary | ICD-10-CM | POA: Insufficient documentation

## 2018-06-08 DIAGNOSIS — M79669 Pain in unspecified lower leg: Secondary | ICD-10-CM | POA: Diagnosis not present

## 2018-06-08 DIAGNOSIS — Y92410 Unspecified street and highway as the place of occurrence of the external cause: Secondary | ICD-10-CM | POA: Insufficient documentation

## 2018-06-08 DIAGNOSIS — R51 Headache: Secondary | ICD-10-CM | POA: Insufficient documentation
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
## 2018-06-09 ENCOUNTER — Encounter (HOSPITAL_COMMUNITY): Payer: Self-pay | Admitting: Emergency Medicine

## 2018-06-09 ENCOUNTER — Ambulatory Visit (INDEPENDENT_AMBULATORY_CARE_PROVIDER_SITE_OTHER): Payer: PRIVATE HEALTH INSURANCE

## 2018-06-09 ENCOUNTER — Ambulatory Visit (HOSPITAL_COMMUNITY)
Admission: EM | Admit: 2018-06-09 | Discharge: 2018-06-09 | Disposition: A | Discharge status: Home / Self Care | Payer: PRIVATE HEALTH INSURANCE | Attending: Family Medicine | Admitting: Family Medicine

## 2018-06-09 DIAGNOSIS — Z8781 Personal history of (healed) traumatic fracture: Secondary | ICD-10-CM

## 2018-06-09 DIAGNOSIS — M79662 Pain in left lower leg: Secondary | ICD-10-CM | POA: Diagnosis not present

## 2018-06-09 DIAGNOSIS — S0003XA Contusion of scalp, initial encounter: Secondary | ICD-10-CM

## 2018-06-09 MED ORDER — IBUPROFEN 600 MG PO TABS: 600.0000 mg | ORAL_TABLET | Freq: Four times a day (QID) | ORAL | 0 refills | Status: DC | PRN

## 2018-06-09 MED ORDER — CYCLOBENZAPRINE HCL 5 MG PO TABS: 5.0000 mg | ORAL_TABLET | Freq: Two times a day (BID) | ORAL | 0 refills | Status: DC | PRN

## 2018-06-09 NOTE — Discharge Instructions (Addendum)
Xray looks stable, no new fractures  Use anti-inflammatories for pain/swelling. You may take up to 600 mg Ibuprofen every 8 hours with food. You may supplement Ibuprofen with Tylenol [phone] mg every 8 hours.   You may use flexeril as needed to help with pain. This is a muscle relaxer and causes sedation- please use only at bedtime or when you will be home and not have to drive/work- begin with 1 tablet, may increase to 2 if needed  Please follow-up in emergency room if developing headache, vision changes, weakness, dizziness/lightheadedness
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
## 2018-06-09 NOTE — ED Notes (Signed)
Patient advised to stay multiple times, decided to leave anyway's.

## 2018-06-13 ENCOUNTER — Encounter: Payer: Self-pay | Admitting: Medical

## 2018-06-13 ENCOUNTER — Other Ambulatory Visit: Payer: Self-pay

## 2018-06-13 ENCOUNTER — Ambulatory Visit (INDEPENDENT_AMBULATORY_CARE_PROVIDER_SITE_OTHER): Payer: Self-pay | Admitting: Medical

## 2018-06-13 VITALS — BP 130/90 | HR 88 | Temp 98.2°F | Resp 16 | Ht 69.0 in | Wt 152.0 lb

## 2018-06-13 DIAGNOSIS — M542 Cervicalgia: Secondary | ICD-10-CM

## 2018-06-13 DIAGNOSIS — S80812A Abrasion, left lower leg, initial encounter: Secondary | ICD-10-CM

## 2018-06-13 DIAGNOSIS — S0081XA Abrasion of other part of head, initial encounter: Secondary | ICD-10-CM

## 2018-06-13 DIAGNOSIS — M549 Dorsalgia, unspecified: Secondary | ICD-10-CM
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
## 2018-11-12 ENCOUNTER — Ambulatory Visit (HOSPITAL_COMMUNITY)
Admission: EM | Admit: 2018-11-12 | Discharge: 2018-11-12 | Disposition: A | Discharge status: Home / Self Care | Payer: Self-pay | Attending: Family Medicine | Admitting: Family Medicine

## 2018-11-12 ENCOUNTER — Ambulatory Visit (INDEPENDENT_AMBULATORY_CARE_PROVIDER_SITE_OTHER): Payer: Self-pay

## 2018-11-12 ENCOUNTER — Encounter (HOSPITAL_COMMUNITY): Payer: Self-pay

## 2018-11-12 ENCOUNTER — Other Ambulatory Visit: Payer: Self-pay

## 2018-11-12 DIAGNOSIS — S93601A Unspecified sprain of right foot, initial encounter: Secondary | ICD-10-CM

## 2018-11-12 MED ORDER — NAPROXEN 500 MG PO TABS: 500.0000 mg | ORAL_TABLET | Freq: Two times a day (BID) | ORAL | 0 refills | Status: DC
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
## 2018-11-12 NOTE — Discharge Instructions (Addendum)
Your x-ray was normal You can take naproxen twice a day as needed for pain and inflammation.  Take this with food. Rest, ice, elevate the foot Follow up as needed for continued or worsening symptoms

## 2019-12-31 ENCOUNTER — Ambulatory Visit (HOSPITAL_COMMUNITY)
Admission: EM | Admit: 2019-12-31 | Discharge: 2019-12-31 | Disposition: A | Discharge status: Home / Self Care | Payer: Self-pay | Attending: Emergency Medicine | Admitting: Emergency Medicine

## 2019-12-31 ENCOUNTER — Ambulatory Visit (INDEPENDENT_AMBULATORY_CARE_PROVIDER_SITE_OTHER): Payer: Self-pay

## 2019-12-31 ENCOUNTER — Other Ambulatory Visit: Payer: Self-pay

## 2019-12-31 ENCOUNTER — Encounter (HOSPITAL_COMMUNITY): Payer: Self-pay | Admitting: Emergency Medicine

## 2019-12-31 DIAGNOSIS — M79662 Pain in left lower leg: Secondary | ICD-10-CM

## 2019-12-31 DIAGNOSIS — S8012XA Contusion of left lower leg, initial encounter: Secondary | ICD-10-CM

## 2019-12-31 MED ORDER — IBUPROFEN 800 MG PO TABS
800.0000 mg | ORAL_TABLET | Freq: Once | ORAL | Status: AC
  Administered 2019-12-31: 800 mg via ORAL

## 2019-12-31 MED ORDER — IBUPROFEN 800 MG PO TABS
ORAL_TABLET | ORAL | Status: AC
  Filled 2019-12-31: qty 1

## 2019-12-31 MED ORDER — IBUPROFEN 800 MG PO TABS: 800.0000 mg | ORAL_TABLET | Freq: Three times a day (TID) | ORAL | 0 refills | Status: DC
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
## 2019-12-31 NOTE — Discharge Instructions (Signed)
Ibuprofen every 8 hours, take with food. Ice, elevation to help with pain.  If no improvement of pain this afternoon after doing these things, or if worsening of pain, numbness or tingling, or change in color to foot, please go to the ER as we think about compartment syndrome when you have this much swelling, which can be emergent.

## 2019-12-31 NOTE — ED Triage Notes (Signed)
Pt presents with left leg pain. States piece of metal fell on leg last night.

## 2022-10-13 ENCOUNTER — Ambulatory Visit: Payer: Medicaid Other | Admitting: Medical

## 2022-10-13 ENCOUNTER — Encounter: Payer: Self-pay | Admitting: Medical

## 2022-10-13 VITALS — BP 130/80 | HR 92 | Ht 70.0 in | Wt 171.4 lb

## 2022-10-13 DIAGNOSIS — Z1329 Encounter for screening for other suspected endocrine disorder: Secondary | ICD-10-CM

## 2022-10-13 DIAGNOSIS — Z13220 Encounter for screening for lipoid disorders: Secondary | ICD-10-CM | POA: Diagnosis not present

## 2022-10-13 DIAGNOSIS — Z113 Encounter for screening for infections with a predominantly sexual mode of transmission: Secondary | ICD-10-CM

## 2022-10-13 DIAGNOSIS — Z0000 Encounter for general adult medical examination without abnormal findings: Secondary | ICD-10-CM

## 2022-10-13 DIAGNOSIS — R683 Clubbing of fingers: Secondary | ICD-10-CM | POA: Insufficient documentation

## 2022-10-13 DIAGNOSIS — Z8679 Personal history of other diseases of the circulatory system: Secondary | ICD-10-CM | POA: Insufficient documentation

## 2022-10-13 LAB — POCT URINALYSIS DIP (PROADVANTAGE DEVICE)
Blood, UA: NEGATIVE
Glucose, UA: NEGATIVE mg/dL
Leukocytes, UA: NEGATIVE
Nitrite, UA: NEGATIVE
Protein Ur, POC: NEGATIVE mg/dL
Specific Gravity, Urine: 1.03
Urobilinogen, Ur: NEGATIVE
pH, UA: 6 (ref 5.0–8.0)

## 2022-10-13 LAB — LIPID PANEL

## 2022-10-13 NOTE — Patient Instructions (Signed)
This visit was a preventative care visit, also known as wellness visit or routine physical.   Topics typically include healthy lifestyle, diet, exercise, preventative care, vaccinations, sick and well care, proper use of emergency dept and after hours care, as well as other concerns.     Separate significant issues discussed: History of hypertension while incarcerated within the past 2 years but normal today.  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  Prior echocardiogram reviewed.   General Recommendations: Continue to return yearly for your annual wellness and preventative care visits.  This gives [place] a chance to discuss healthy lifestyle, exercise, vaccinations, review your chart record, and perform screenings where appropriate.  I recommend you see your eye doctor yearly for routine vision care.  I recommend you see your dentist yearly for routine dental care including hygiene visits twice yearly.   Vaccination   There is no immunization history on file for this patient.  We will try to obtain prior vaccine records   Screening for cancer: Colon cancer screening: Age 60  Testicular cancer screening You should do a monthly self testicular exam if you are between 76-40 years old, and we typically do a testicular exam on the yearly physical for this same age group.   Prostate Cancer screening: The recommended prostate cancer screening test is a blood test called the prostate-specific antigen (PSA) test. PSA is a protein that is made in the prostate. As you age, your prostate naturally produces more PSA. Abnormally high PSA levels may be caused by: Prostate cancer. An enlarged prostate that is not caused by cancer (benign prostatic hyperplasia, or BPH). This condition is very common in older men. A prostate gland infection (prostatitis) or urinary tract infection. Certain medicines such as male hormones (like testosterone) or  other medicines that raise testosterone levels. A rectal exam may be done as part of prostate cancer screening to help provide information about the size of your prostate gland. When a rectal exam is performed, it should be done after the PSA level is drawn to avoid any effect on the results.   Skin cancer screening: Check your skin regularly for new changes, growing lesions, or other lesions of concern Come in for evaluation if you have skin lesions of concern.   Lung cancer screening: If you have a greater than 20 pack year history of tobacco use, then you may qualify for lung cancer screening with a chest CT scan.   Please call your insurance company to inquire about coverage for this test.   Pancreatic cancer:  no current screening test is available or routinely recommended. (risk factors: smoking, overweight or obese, diabetes, chronic pancreatitis, work exposure - dry cleaning, metal working, 32yo>, M>F, Tree surgeon, family hx/o, hereditary breast, ovarian, melanoma, lynch, peutz-jeghers).  Symptoms: jaundice, dark urine, light color or greasy stools, itchy skin, belly or back pain, weight loss, poor appetite, nause, vomiting, liver enlargement, DVT/blood clots.   We currently don't have screenings for other cancers besides breast, cervical, colon, and lung cancers.  If you have a strong family history of cancer or have other cancer screening concerns, please let me know.  Genetic testing referral is an option for individuals with high cancer risk in the family.  There are some other cancer screenings in development currently.   Bone health: Get at least 150 minutes of aerobic exercise weekly Get weight bearing exercise at least once weekly Bone density test:  A bone density test  is an imaging test that uses a type of X-ray to measure the amount of calcium and other minerals in your bones. The test may be used to diagnose or screen you for a condition that causes weak or thin bones  (osteoporosis), predict your risk for a broken bone (fracture), or determine how well your osteoporosis treatment is working. The bone density test is recommended for females 65 and older, or females or males <65 if certain risk factors such as thyroid disease, long term use of steroids such as for asthma or rheumatological issues, vitamin D deficiency, estrogen deficiency, family history of osteoporosis, self or family history of fragility fracture in first degree relative.    Heart health: Get at least 150 minutes of aerobic exercise weekly Limit alcohol It is important to maintain a healthy blood pressure and healthy cholesterol numbers  Heart disease screening: Screening for heart disease includes screening for blood pressure, fasting lipids, glucose/diabetes screening, BMI height to weight ratio, reviewed of smoking status, physical activity, and diet.    Goals include blood pressure 120/80 or less, maintaining a healthy lipid/cholesterol profile, preventing diabetes or keeping diabetes numbers under good control, not smoking or using tobacco products, exercising most days per week or at least 150 minutes per week of exercise, and eating healthy variety of fruits and vegetables, healthy oils, and avoiding unhealthy food choices like fried food, fast food, high sugar and high cholesterol foods.     Vascular disease screening: For higher risk individuals including smokers, diabetics, patients with known heart disease or high blood pressure, kidney disease, and others, screening for vascular disease or atherosclerosis of the arteries is available.  Examples may include carotid ultrasound, abdominal aortic ultrasound, ABI blood flow screening in the legs, thoracic aorta screening.    Medical care options: I recommend you continue to seek care here first for routine care.  We try really hard to have available appointments Monday through Friday daytime hours for sick visits, acute visits, and  physicals.  Urgent care should be used for after hours and weekends for significant issues that cannot wait till the next day.  The emergency department should be used for significant potentially life-threatening emergencies.  The emergency department is expensive, can often have long wait times for less significant concerns, so try to utilize primary care, urgent care, or telemedicine when possible to avoid unnecessary trips to the emergency department.  Virtual visits and telemedicine have been introduced since the pandemic started in 2020, and can be convenient ways to receive medical care.  We offer virtual appointments as well to assist you in a variety of options to seek medical care.   Legal  Take the time to do a last will and testament, Advanced Directives including Health Care Power of Attorney and Living Will documents.  Don't leave your family with burdens that can be handled ahead of time.   Advanced Directives: I recommend you consider completing a Health Care Power of Attorney and Living Will.   These documents respect your wishes and help alleviate burdens on your loved ones if you were to become terminally ill or be in a position to need those documents enforced.    You can complete Advanced Directives yourself, have them notarized, then have copies made for our office, for you and for anybody you feel should have them in safe keeping.  Or, you can have an attorney prepare these documents.   If you haven't updated your Last Will and Testament in a while, it  may be worthwhile having an attorney prepare these documents together and save on some costs.       Spiritual and Emotional Health Keeping a healthy spiritual life can help you better manage your physical health. Your spiritual life can help you to cope with any issues that may arise with your physical health.  Balance can keep [place] healthy and help [place] to recover.  If you are struggling with your spiritual health there are  questions that you may want to ask yourself:  What makes me feel most complete? When do I feel most connected to the rest of the world? Where do I find the most inner strength? What am I doing when I feel whole?  Helpful tips: Being in nature. Some people feel very connected and at peace when they are walking outdoors or are outside. Helping others. Some feel the largest sense of wellbeing when they are of service to others. Being of service can take on many forms. It can be doing volunteer work, being kind to strangers, or offering a hand to a friend in need. Gratitude. Some people find they feel the most connected when they remain grateful. They may make lists of all the things they are grateful for or say a thank you out loud for all they have.    Emotional Health Are you in tune with your emotional health?  Check out this link: http://www.marquez-love.com/    Financial Health Make sure you use a budget for your personal finances Make sure you are insured against risks (health insurance, life insurance, auto insurance, etc) Save more, spend less Set financial goals If you need help in this area, good resources include counseling through [employer] or other community resources, have a meeting with a Social research officer, government, and a good resource is the [employer]
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
## 2022-10-16 LAB — LIPID PANEL
HDL: 43 mg/dL (ref >39)
LDL Chol Calc (NIH): 113 mg/dL — ABNORMAL HIGH (ref 0–99)
VLDL Cholesterol Cal: 16 mg/dL (ref 5–40)

## 2022-10-16 LAB — HEPATITIS C ANTIBODY: Hep C Virus Ab: NONREACTIVE

## 2022-10-16 LAB — COMPREHENSIVE METABOLIC PANEL
BUN/Creatinine Ratio: 20 (ref 9–20)
Bilirubin Total: 0.2 mg/dL (ref 0.0–1.2)
CO2: 24 mmol/L (ref 20–29)
Creatinine, Ser: 1.04 mg/dL (ref 0.76–1.27)
Globulin, Total: 2.9 g/dL (ref 1.5–4.5)
Total Protein: 7.4 g/dL (ref 6.0–8.5)

## 2022-10-16 LAB — RPR+HIV+GC+CT PANEL
Chlamydia trachomatis, NAA: NEGATIVE
Neisseria Gonorrhoeae by PCR: NEGATIVE

## 2022-10-16 LAB — CBC
Hemoglobin: 14.2 g/dL (ref 13.0–17.7)
MCHC: 32.9 g/dL (ref 31.5–35.7)
RBC: 5.13 x10E6/uL (ref 4.14–5.80)
RDW: 14.1 % (ref 11.6–15.4)
WBC: 5.3 10*3/uL (ref 3.4–10.8)

## 2022-10-16 LAB — HEPATITIS B SURFACE ANTIGEN: Hepatitis B Surface Ag: NEGATIVE

## 2022-10-16 NOTE — Progress Notes (Signed)
 Results sent through MyChart

## 2022-10-17 LAB — TSH: TSH: 1.9 u[IU]/mL (ref 0.450–4.500)

## 2022-10-17 LAB — COMPREHENSIVE METABOLIC PANEL
ALT: 8 IU/L (ref 0–44)
AST: 15 IU/L (ref 0–40)
Albumin: 4.5 g/dL (ref 4.1–5.1)
Alkaline Phosphatase: 93 IU/L (ref 44–121)
BUN: 21 mg/dL — ABNORMAL HIGH (ref 6–20)
Calcium: 9.9 mg/dL (ref 8.7–10.2)
Chloride: 104 mmol/L (ref 96–106)
Glucose: 95 mg/dL (ref 70–99)
Potassium: 4.2 mmol/L (ref 3.5–5.2)
Sodium: 143 mmol/L (ref 134–144)
eGFR: 97 mL/min/{1.73_m2} (ref >59)

## 2022-10-17 LAB — LIPID PANEL
Chol/HDL Ratio: 4 ratio (ref 0.0–5.0)
Cholesterol, Total: 172 mg/dL (ref 100–199)
Triglycerides: 85 mg/dL (ref 0–149)

## 2022-10-17 LAB — CBC
Hematocrit: 43.2 % (ref 37.5–51.0)
MCH: 27.7 pg (ref 26.6–33.0)
MCV: 84 fL (ref 79–97)
Platelets: 252 10*3/uL (ref 150–450)

## 2022-10-17 LAB — RPR+HIV+GC+CT PANEL: RPR Ser Ql: NONREACTIVE

## 2022-10-17 NOTE — Progress Notes (Signed)
 Results sent through MyChart

## 2023-01-07 ENCOUNTER — Encounter (HOSPITAL_COMMUNITY): Payer: Self-pay

## 2023-01-07 ENCOUNTER — Other Ambulatory Visit: Payer: Self-pay

## 2023-01-07 ENCOUNTER — Emergency Department (HOSPITAL_COMMUNITY)
Admission: EM | Admit: 2023-01-07 | Discharge: 2023-01-07 | Disposition: A | Discharge status: Home / Self Care | Payer: Medicaid Other | Attending: Emergency Medicine | Admitting: Emergency Medicine

## 2023-01-07 ENCOUNTER — Emergency Department (HOSPITAL_COMMUNITY): Payer: Medicaid Other

## 2023-01-07 ENCOUNTER — Ambulatory Visit (HOSPITAL_COMMUNITY)
Admission: EM | Admit: 2023-01-07 | Discharge: 2023-01-07 | Disposition: A | Discharge status: Home / Self Care | Payer: Medicaid Other

## 2023-01-07 DIAGNOSIS — S0592XA Unspecified injury of left eye and orbit, initial encounter: Secondary | ICD-10-CM | POA: Diagnosis present

## 2023-01-07 DIAGNOSIS — W500XXA Accidental hit or strike by another person, initial encounter: Secondary | ICD-10-CM | POA: Diagnosis not present

## 2023-01-07 DIAGNOSIS — S0285XA Fracture of orbit, unspecified, initial encounter for closed fracture: Secondary | ICD-10-CM | POA: Diagnosis not present

## 2023-01-07 DIAGNOSIS — S0590XA Unspecified injury of unspecified eye and orbit, initial encounter: Secondary | ICD-10-CM

## 2023-01-07 LAB — I-STAT CHEM 8, ED
BUN: 19 mg/dL (ref 6–20)
Calcium, Ion: 1.1 mmol/L — ABNORMAL LOW (ref 1.15–1.40)
Chloride: 102 mmol/L (ref 98–111)
Creatinine, Ser: 1 mg/dL (ref 0.61–1.24)
Glucose, Bld: 113 mg/dL — ABNORMAL HIGH (ref 70–99)
HCT: 47 % (ref 39.0–52.0)
Hemoglobin: 16 g/dL (ref 13.0–17.0)
Potassium: 4 mmol/L (ref 3.5–5.1)
Sodium: 140 mmol/L (ref 135–145)
TCO2: 28 mmol/L (ref 22–32)

## 2023-01-07 LAB — I-STAT CREATININE, ED: Creatinine, Ser: 1 mg/dL (ref 0.61–1.24)

## 2023-01-07 MED ORDER — IOHEXOL 350 MG/ML SOLN
60.0000 mL | Freq: Once | INTRAVENOUS | Status: AC | PRN
  Administered 2023-01-07: 60 mL via INTRAVENOUS

## 2023-01-07 MED ORDER — CEPHALEXIN 500 MG PO CAPS
500.0000 mg | ORAL_CAPSULE | Freq: Two times a day (BID) | ORAL | 0 refills | Status: DC
Start: 2023-01-07 — End: 2023-02-08
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
## 2023-01-07 NOTE — ED Notes (Signed)
Pt provided ice pack 
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
## 2023-01-07 NOTE — ED Notes (Signed)
Pt states he can see out both eyes as normal. Pt denies any blurred vision. Pt states it feels like something in his eye.
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
## 2023-01-11 ENCOUNTER — Ambulatory Visit: Payer: Medicaid Other | Admitting: Medical

## 2023-01-11 VITALS — BP 138/88 | HR 77 | Wt 167.6 lb

## 2023-01-11 DIAGNOSIS — S022XXD Fracture of nasal bones, subsequent encounter for fracture with routine healing: Secondary | ICD-10-CM | POA: Diagnosis not present

## 2023-01-11 DIAGNOSIS — H5789 Other specified disorders of eye and adnexa: Secondary | ICD-10-CM | POA: Diagnosis not present

## 2023-01-11 DIAGNOSIS — S0285XD Fracture of orbit, unspecified, subsequent encounter for fracture with routine healing: Secondary | ICD-10-CM | POA: Diagnosis not present

## 2023-01-11 DIAGNOSIS — I10 Essential (primary) hypertension: Secondary | ICD-10-CM | POA: Diagnosis not present

## 2023-01-11 NOTE — Patient Instructions (Addendum)
Start monitoring BP at home, check 3-4 days per week.  Don't check 3-4 times per day  If your BPs are staying over 130/80 regularly then lets consider going back on medication  Normal is around 120/70 Borderline is  >130/80 Stage 1 HTN is 140/90 or higher, which generally requires 1 or more BP medicaiton Stage 2 HTN is 160/100 or higher which generally requires 2 or more BP medicaiton    Hypertension, Adult Hypertension is another name for high blood pressure. High blood pressure forces your heart to work harder to pump blood. This can cause problems over time. There are two numbers in a blood pressure reading. There is a top number (systolic) over a bottom number (diastolic). It is best to have a blood pressure that is below 120/80. What are the causes? The cause of this condition is not known. Some other conditions can lead to high blood pressure. What increases the risk? Some lifestyle factors can make you more likely to develop high blood pressure: Smoking. Not getting enough exercise or physical activity. Being overweight. Having too much fat, sugar, calories, or salt (sodium) in your diet. Drinking too much alcohol. Other risk factors include: Having any of these conditions: Heart disease. Diabetes. High cholesterol. Kidney disease. Obstructive sleep apnea. Having a family history of high blood pressure and high cholesterol. Age. The risk increases with age. Stress. What are the signs or symptoms? High blood pressure may not cause symptoms. Very high blood pressure (hypertensive crisis) may cause: Headache. Fast or uneven heartbeats (palpitations). Shortness of breath. Nosebleed. Vomiting or feeling like you may vomit (nauseous). Changes in how you see. Very bad chest pain. Feeling dizzy. Seizures. How is this treated? This condition is treated by making healthy lifestyle changes, such as: Eating healthy foods. Exercising more. Drinking less alcohol. Your doctor  may prescribe medicine if lifestyle changes do not help enough and if: Your top number is above 130. Your bottom number is above 80. Your personal target blood pressure may vary. Follow these instructions at home: Eating and drinking  If told, follow the DASH eating plan. To follow this plan: Fill one half of your plate at each meal with fruits and vegetables. Fill one fourth of your plate at each meal with whole grains. Whole grains include whole-wheat pasta, brown rice, and whole-grain bread. Eat or drink low-fat dairy products, such as skim milk or low-fat yogurt. Fill one fourth of your plate at each meal with low-fat (lean) proteins. Low-fat proteins include fish, chicken without skin, eggs, beans, and tofu. Avoid fatty meat, cured and processed meat, or chicken with skin. Avoid pre-made or processed food. Limit the amount of salt in your diet to less than 1,500 mg each day. Do not drink alcohol if: Your doctor tells you not to drink. You are pregnant, may be pregnant, or are planning to become pregnant. If you drink alcohol: Limit how much you have to: 0-1 drink a day for women. 0-2 drinks a day for men. Know how much alcohol is in your drink. In the [place] one drink equals one 12 oz bottle of beer (355 mL), one 5 oz glass of wine (148 mL), or one 1 oz glass of hard liquor (44 mL). Lifestyle  Work with your doctor to stay at a healthy weight or to lose weight. Ask your doctor what the best weight is for you. Get at least 30 minutes of exercise that causes your heart to beat faster (aerobic exercise) most days of the week.  This may include walking, swimming, or biking. Get at least 30 minutes of exercise that strengthens your muscles (resistance exercise) at least 3 days a week. This may include lifting weights or doing Pilates. Do not smoke or use any products that contain nicotine or tobacco. If you need help quitting, ask your doctor. Check your blood pressure at home as told by  your doctor. Keep all follow-up visits. Medicines Take over-the-counter and prescription medicines only as told by your doctor. Follow directions carefully. Do not skip doses of blood pressure medicine. The medicine does not work as well if you skip doses. Skipping doses also puts you at risk for problems. Ask your doctor about side effects or reactions to medicines that you should watch for. Contact a doctor if: You think you are having a reaction to the medicine you are taking. You have headaches that keep coming back. You feel dizzy. You have swelling in your ankles. You have trouble with your vision. Get help right away if: You get a very bad headache. You start to feel mixed up (confused). You feel weak or numb. You feel faint. You have very bad pain in your: Chest. Belly (abdomen). You vomit more than once. You have trouble breathing. These symptoms may be an emergency. Get help right away. Call 911. Do not wait to see if the symptoms will go away. Do not drive yourself to the hospital. Summary Hypertension is another name for high blood pressure. High blood pressure forces your heart to work harder to pump blood. For most people, a normal blood pressure is less than 120/80. Making healthy choices can help lower blood pressure. If your blood pressure does not get lower with healthy choices, you may need to take medicine. This information is not intended to replace advice given to you by your health care provider. Make sure you discuss any questions you have with your health care provider. Document Revised: 01/06/2021 Document Reviewed: 01/06/2021 Elsevier Patient Education  2024 [employer].
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
## 2023-02-02 DEATH — deceased

## 2023-02-07 ENCOUNTER — Telehealth: Payer: Self-pay | Admitting: Medical
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
